# Patient Record
Sex: Male | Born: 1960 | Race: White | Hispanic: No | Smoking: Current every day smoker
Health system: Southern US, Community
[De-identification: ages and names within clinical notes are randomized; demographics above are authoritative.]

## PROBLEM LIST (undated history)

## (undated) DIAGNOSIS — J449 Chronic obstructive pulmonary disease, unspecified: Secondary | ICD-10-CM

## (undated) HISTORY — PX: JOINT REPLACEMENT: SHX530

---

## 2008-08-03 ENCOUNTER — Emergency Department (HOSPITAL_COMMUNITY): Admission: EM | Admit: 2008-08-03 | Discharge: 2008-08-03 | Payer: Self-pay | Admitting: Emergency Medicine

## 2008-08-08 ENCOUNTER — Emergency Department (HOSPITAL_COMMUNITY): Admission: EM | Admit: 2008-08-08 | Discharge: 2008-08-08 | Payer: Self-pay | Admitting: Family Medicine

## 2008-08-24 ENCOUNTER — Emergency Department (HOSPITAL_COMMUNITY): Admission: EM | Admit: 2008-08-24 | Discharge: 2008-08-24 | Payer: Self-pay | Admitting: Family Medicine

## 2008-09-26 ENCOUNTER — Emergency Department (HOSPITAL_COMMUNITY): Admission: EM | Admit: 2008-09-26 | Discharge: 2008-09-26 | Payer: Self-pay | Admitting: Emergency Medicine

## 2008-10-27 ENCOUNTER — Emergency Department (HOSPITAL_COMMUNITY): Admission: EM | Admit: 2008-10-27 | Discharge: 2008-10-27 | Payer: Self-pay | Admitting: Emergency Medicine

## 2008-11-21 ENCOUNTER — Emergency Department (HOSPITAL_COMMUNITY): Admission: EM | Admit: 2008-11-21 | Discharge: 2008-11-21 | Payer: Self-pay | Admitting: Family Medicine

## 2008-11-30 ENCOUNTER — Emergency Department (HOSPITAL_COMMUNITY): Admission: EM | Admit: 2008-11-30 | Discharge: 2008-12-01 | Payer: Self-pay | Admitting: Emergency Medicine

## 2008-12-13 ENCOUNTER — Ambulatory Visit (HOSPITAL_COMMUNITY): Admission: RE | Admit: 2008-12-13 | Discharge: 2008-12-13 | Payer: Self-pay | Admitting: General Surgery

## 2008-12-22 ENCOUNTER — Emergency Department (HOSPITAL_COMMUNITY): Admission: EM | Admit: 2008-12-22 | Discharge: 2008-12-22 | Payer: Self-pay | Admitting: Family Medicine

## 2009-01-22 ENCOUNTER — Emergency Department (HOSPITAL_COMMUNITY): Admission: EM | Admit: 2009-01-22 | Discharge: 2009-01-22 | Payer: Self-pay | Admitting: Family Medicine

## 2009-02-19 ENCOUNTER — Emergency Department (HOSPITAL_COMMUNITY): Admission: EM | Admit: 2009-02-19 | Discharge: 2009-02-19 | Payer: Self-pay | Admitting: Family Medicine

## 2009-03-21 ENCOUNTER — Emergency Department (HOSPITAL_COMMUNITY): Admission: EM | Admit: 2009-03-21 | Discharge: 2009-03-21 | Payer: Self-pay | Admitting: Emergency Medicine

## 2010-12-09 LAB — BASIC METABOLIC PANEL
BUN: 19 mg/dL (ref 6–23)
CO2: 27 mEq/L (ref 19–32)
Chloride: 107 mEq/L (ref 96–112)
Glucose, Bld: 111 mg/dL — ABNORMAL HIGH (ref 70–99)
Potassium: 4 mEq/L (ref 3.5–5.1)

## 2010-12-09 LAB — URINALYSIS, ROUTINE W REFLEX MICROSCOPIC
Ketones, ur: 15 mg/dL — AB
Leukocytes, UA: NEGATIVE
Nitrite: NEGATIVE
Protein, ur: NEGATIVE mg/dL
pH: 6 (ref 5.0–8.0)

## 2010-12-09 LAB — CBC
HCT: 43 % (ref 39.0–52.0)
MCHC: 34.8 g/dL (ref 30.0–36.0)
MCV: 94 fL (ref 78.0–100.0)
Platelets: 244 10*3/uL (ref 150–400)
RDW: 13.3 % (ref 11.5–15.5)

## 2010-12-09 LAB — DIFFERENTIAL
Basophils Absolute: 0.1 10*3/uL (ref 0.0–0.1)
Eosinophils Absolute: 0.1 10*3/uL (ref 0.0–0.7)
Eosinophils Relative: 2 % (ref 0–5)
Lymphs Abs: 1.6 10*3/uL (ref 0.7–4.0)

## 2011-01-12 NOTE — Op Note (Signed)
NAME:  Mason, Gray NO.:  [id number]   MEDICAL RECORD NO.:  [id number]          PATIENT TYPE:  AMB   LOCATION:  DAY                          FACILITY:  [facility]   PHYSICIAN:  Lennie Muckle, MD      DATE OF BIRTH:  [birth year]   DATE OF PROCEDURE:  12/13/2008  DATE OF DISCHARGE:                               OPERATIVE REPORT   PREOPERATIVE DIAGNOSES:  Right inguinal hernia.   POSTOPERATIVE DIAGNOSES:  Right inguinal hernia.   PROCEDURE:  Laparoscopic right inguinal hernia repair.   SURGEON:  Bertram Savin, M.D.   ASSISTANT:  None.   ANESTHESIA:  General endotracheal anesthesia.   FINDINGS:  Right inguinal hernia.   COMPLICATIONS:  No immediate complications.   DRAINS:  No drains were placed.   SPECIMENS:  None.   INDICATIONS FOR PROCEDURE:  Mason Gray is a pleasant 50 year old male who  began having right-sided groin pain in early April.  He has been  diagnosed with an inguinal hernia as a teenager.  He states he had  worsening pain with exertion.  I had seen him and did find a right  inguinal hernia.  It was discussed with him laparoscopic versus open  procedure.  Risks of both procedures were explained.  He elected to  perform laparoscopic repair.  I did talk to him about possibly  converting to an open procedure due to the chronic nature of his hernia.  Informed consent was obtained prior to the procedure.   DETAILS OF PROCEDURE:  Mason Gray was identified in the preoperative  holding area.  He received 2 g of Kefzol and was taken to the operating  room, once in the operating room, placed in a supine position.  He did  void prior to going to the operating room.  He was then placed under  general endotracheal anesthesia.  His abdomen was clipped, prepped and  draped in the usual sterile fashion.  He was placed in a flexed position  to open up the abdominal cavity.  After prepping out his abdominal  cavity, I placed an incision just beneath the umbilicus  after  anesthetizing the skin with Marcaine.  I identified the anterior rectus  fascia.  This was incised with a #11-blade.  I then finger-dissected in  the preperitoneal space while retracting the rectus muscle to the right.  I then placed a Spacemaker Plus into the preperitoneal space.  Using the  35-mm camera, I monitored this while insufflating the balloon.  I  counted approximately 30 times.  I waited approximately a minute.  I  retracted the balloon somewhat cranially.  I then reinsufflated under  inspection with the camera.  I waited another minute.  I removed the  dissecting balloon.  I then insufflated the balloon on the port.   The preperitoneal space was insufflated with CO2.  I placed two 5-mm  trocars under visualization with the camera at the midline.  I began  dissecting laterally along the abdominal wall.  A small hole was made in  the peritoneum.  I closed this with PDS Endoloop.  I then continued  dissecting from the abdominal wall and down to the internal ring.  I was  able to fully dissect the hernia sac away from the spermatic cord and  vessels.  This was somewhat chronic and thickened.  I performed some of  the dissection sharply with laparoscopic scissors.  Another hole was  made in the peritoneum.  After I finished dissecting, I closed this with  a PDS Endoloop.  I was able to fully dissect the sac away from the vas  and the spermatic vessels.  I then placed a 3 x 6 Ultrapro mesh into the  preperitoneal space.  This was tacked to Cooper's ligament, around the  internal ring and laterally while palpating the Protec device.  I held  the inferior edge down while releasing the pneumoperitoneum.  After  removing the trocars I closed the fascial defect at the umbilical region  with a 0 Vicryl suture.  Skin was closed with 4-0 Monocryl.  Steri-  Strips placed and final dressing.  The patient was extubated,  transferred to postanesthesia care unit in stable condition.    He will be monitored briefly postoperatively and will be discharged home  with Percocet.  He was instructed to follow up with me in 3 weeks.      Lennie Muckle, MD  Electronically Signed     ALA/MEDQ  D:  12/13/2008  T:  12/13/2008  Job:  [id number]

## 2011-07-01 ENCOUNTER — Emergency Department (HOSPITAL_COMMUNITY)
Admission: EM | Admit: 2011-07-01 | Discharge: 2011-07-01 | Disposition: A | Discharge status: Home / Self Care | Payer: Self-pay | Attending: Emergency Medicine | Admitting: Emergency Medicine

## 2011-07-01 DIAGNOSIS — M545 Low back pain, unspecified: Secondary | ICD-10-CM | POA: Insufficient documentation

## 2011-07-01 DIAGNOSIS — Z8619 Personal history of other infectious and parasitic diseases: Secondary | ICD-10-CM | POA: Insufficient documentation

## 2011-07-01 DIAGNOSIS — G8929 Other chronic pain: Secondary | ICD-10-CM | POA: Insufficient documentation

## 2011-07-01 DIAGNOSIS — R000 Tachycardia, unspecified: Secondary | ICD-10-CM | POA: Insufficient documentation

## 2011-07-01 DIAGNOSIS — F411 Generalized anxiety disorder: Secondary | ICD-10-CM | POA: Insufficient documentation

## 2017-03-07 ENCOUNTER — Emergency Department: Payer: Self-pay

## 2017-03-07 ENCOUNTER — Emergency Department
Admission: EM | Admit: 2017-03-07 | Discharge: 2017-03-07 | Disposition: A | Discharge status: Home / Self Care | Payer: Self-pay | Attending: Emergency Medicine | Admitting: Emergency Medicine

## 2017-03-07 ENCOUNTER — Encounter: Payer: Self-pay | Admitting: Emergency Medicine

## 2017-03-07 DIAGNOSIS — R52 Pain, unspecified: Secondary | ICD-10-CM

## 2017-03-07 DIAGNOSIS — R0602 Shortness of breath: Secondary | ICD-10-CM | POA: Insufficient documentation

## 2017-03-07 DIAGNOSIS — F17210 Nicotine dependence, cigarettes, uncomplicated: Secondary | ICD-10-CM | POA: Insufficient documentation

## 2017-03-07 DIAGNOSIS — R1013 Epigastric pain: Secondary | ICD-10-CM | POA: Insufficient documentation

## 2017-03-07 DIAGNOSIS — M791 Myalgia: Secondary | ICD-10-CM | POA: Insufficient documentation

## 2017-03-07 DIAGNOSIS — Z791 Long term (current) use of non-steroidal anti-inflammatories (NSAID): Secondary | ICD-10-CM | POA: Insufficient documentation

## 2017-03-07 DIAGNOSIS — Z8709 Personal history of other diseases of the respiratory system: Secondary | ICD-10-CM | POA: Insufficient documentation

## 2017-03-07 LAB — HEPATIC FUNCTION PANEL
ALBUMIN: 4.3 g/dL (ref 3.5–5.0)
ALK PHOS: 57 U/L (ref 38–126)
ALT: 19 U/L (ref 17–63)
AST: 30 U/L (ref 15–41)
Bilirubin, Direct: 0.3 mg/dL (ref 0.1–0.5)
Indirect Bilirubin: 0.9 mg/dL (ref 0.3–0.9)
TOTAL PROTEIN: 7.7 g/dL (ref 6.5–8.1)
Total Bilirubin: 1.2 mg/dL (ref 0.3–1.2)

## 2017-03-07 LAB — BASIC METABOLIC PANEL
ANION GAP: 10 (ref 5–15)
BUN: 23 mg/dL — ABNORMAL HIGH (ref 6–20)
CHLORIDE: 105 mmol/L (ref 101–111)
CO2: 25 mmol/L (ref 22–32)
Calcium: 9.7 mg/dL (ref 8.9–10.3)
Creatinine, Ser: 1.09 mg/dL (ref 0.61–1.24)
Glucose, Bld: 98 mg/dL (ref 65–99)
POTASSIUM: 4 mmol/L (ref 3.5–5.1)
SODIUM: 140 mmol/L (ref 135–145)

## 2017-03-07 LAB — CBC WITH DIFFERENTIAL/PLATELET
BASOS ABS: 0.1 10*3/uL (ref 0–0.1)
BASOS PCT: 1 %
EOS ABS: 0.1 10*3/uL (ref 0–0.7)
Eosinophils Relative: 1 %
HCT: 47.6 % (ref 40.0–52.0)
HEMOGLOBIN: 16.1 g/dL (ref 13.0–18.0)
LYMPHS ABS: 2.6 10*3/uL (ref 1.0–3.6)
Lymphocytes Relative: 19 %
MCH: 31.1 pg (ref 26.0–34.0)
MCHC: 33.8 g/dL (ref 32.0–36.0)
MCV: 92.3 fL (ref 80.0–100.0)
Monocytes Absolute: 1.5 10*3/uL — ABNORMAL HIGH (ref 0.2–1.0)
Monocytes Relative: 11 %
NEUTROS PCT: 70 %
Neutro Abs: 9.7 10*3/uL — ABNORMAL HIGH (ref 1.4–6.5)
PLATELETS: 389 10*3/uL (ref 150–440)
RBC: 5.16 MIL/uL (ref 4.40–5.90)
RDW: 13.3 % (ref 11.5–14.5)
WBC: 14 10*3/uL — AB (ref 3.8–10.6)

## 2017-03-07 LAB — LIPASE, BLOOD: Lipase: 53 U/L — ABNORMAL HIGH (ref 11–51)

## 2017-03-07 MED ORDER — KETOROLAC TROMETHAMINE 30 MG/ML IJ SOLN
15.0000 mg | Freq: Once | INTRAMUSCULAR | Status: AC
  Administered 2017-03-07: 15 mg via INTRAVENOUS
  Filled 2017-03-07: qty 1

## 2017-03-07 MED ORDER — IBUPROFEN 600 MG PO TABS: 600.0000 mg | ORAL_TABLET | Freq: Three times a day (TID) | ORAL | 0 refills | Status: AC | PRN

## 2017-03-07 NOTE — ED Notes (Addendum)
Pt. Verbalizes understanding of d/c instructions, prescriptions, and follow-up. VS stable and pain controlled per pt.  Pt. In NAD at time of d/c and denies further concerns regarding this visit. Pt. Stable at the time of departure from the unit, departing unit by the safest and most appropriate manner per that pt condition and limitations. Pt advised to return to the ED at any time for emergent concerns, or for new/worsening symptoms.   Pt placed in lobby with belongings d/t refusal to wait for [facility] in tx room 6. [employer] aware of situation at that [facility] at [facility] states if pt walks outside lobby will be d/c from program. BPD, this RN, pt all aware.

## 2017-03-07 NOTE — ED Notes (Signed)
Pt in US at this time 

## 2017-03-07 NOTE — Discharge Instructions (Signed)
Please take ibuprofen every 8 hours as needed for pain and establish care with primary care within the next week for recheck. Return to the emergency department for any concerns.  It was a pleasure to take care of you today, and thank you for coming to our emergency department.  If you have any questions or concerns before leaving please ask the nurse to grab me and I'm more than happy to go through your aftercare instructions again.  If you were prescribed any opioid pain medication today such as Norco, Vicodin, Percocet, morphine, hydrocodone, or oxycodone please make sure you do not drive when you are taking this medication as it can alter your ability to drive safely.  If you have any concerns once you are home that you are not improving or are in fact getting worse before you can make it to your follow-up appointment, please do not hesitate to call 911 and come back for further evaluation.  Merrily BrittleNeil Talajah Slimp MD  Results for orders placed or performed during the hospital encounter of 03/07/17  CBC with Differential  Result Value Ref Range   WBC 14.0 (H) 3.8 - 10.6 K/uL   RBC 5.16 4.40 - 5.90 MIL/uL   Hemoglobin 16.1 13.0 - 18.0 g/dL   HCT 14.747.6 82.940.0 - 56.252.0 %   MCV 92.3 80.0 - 100.0 fL   MCH 31.1 26.0 - 34.0 pg   MCHC 33.8 32.0 - 36.0 g/dL   RDW 13.013.3 86.511.5 - 78.414.5 %   Platelets 389 150 - 440 K/uL   Neutrophils Relative % 70 %   Neutro Abs 9.7 (H) 1.4 - 6.5 K/uL   Lymphocytes Relative 19 %   Lymphs Abs 2.6 1.0 - 3.6 K/uL   Monocytes Relative 11 %   Monocytes Absolute 1.5 (H) 0.2 - 1.0 K/uL   Eosinophils Relative 1 %   Eosinophils Absolute 0.1 0 - 0.7 K/uL   Basophils Relative 1 %   Basophils Absolute 0.1 0 - 0.1 K/uL  Basic metabolic panel  Result Value Ref Range   Sodium 140 135 - 145 mmol/L   Potassium 4.0 3.5 - 5.1 mmol/L   Chloride 105 101 - 111 mmol/L   CO2 25 22 - 32 mmol/L   Glucose, Bld 98 65 - 99 mg/dL   BUN 23 (H) 6 - 20 mg/dL   Creatinine, Ser 6.961.09 0.61 - 1.24 mg/dL   Calcium 9.7 8.9 - 29.510.3 mg/dL   GFR calc non Af Amer >60 >60 mL/min   GFR calc Af Amer >60 >60 mL/min   Anion gap 10 5 - 15  Hepatic function panel  Result Value Ref Range   Total Protein 7.7 6.5 - 8.1 g/dL   Albumin 4.3 3.5 - 5.0 g/dL   AST 30 15 - 41 U/L   ALT 19 17 - 63 U/L   Alkaline Phosphatase 57 38 - 126 U/L   Total Bilirubin 1.2 0.3 - 1.2 mg/dL   Bilirubin, Direct 0.3 0.1 - 0.5 mg/dL   Indirect Bilirubin 0.9 0.3 - 0.9 mg/dL  Lipase, blood  Result Value Ref Range   Lipase 53 (H) 11 - 51 U/L   Dg Chest 2 View  Result Date: 03/07/2017 CLINICAL DATA:  Chest pain and shortness of breath. History of pneumothorax. EXAM: CHEST  2 VIEW COMPARISON:  None. FINDINGS: Emphysema with bullous changes at the apices and chain sutures at the right lung apex. Chain sutures also noted posteriorly, possibly in the left upper lung. No pneumothorax. Normal heart size  and mediastinal contours. No pulmonary edema. No focal airspace consolidation. No pleural fluid. No acute osseous abnormalities, minimal loss of height of midthoracic vertebral bodies appears chronic. Remote right rib fractures. IMPRESSION: Emphysema with biapical bullous change. Postsurgical change on the right and possibly left upper lobes. No acute abnormality. No pneumothorax. Electronically Signed   By: Rubye Oaks M.D.   On: 03/07/2017 18:06   US Abdomen Limited Ruq  Result Date: 03/07/2017 CLINICAL DATA:  Abdomen pain for 1 day EXAM: ULTRASOUND ABDOMEN LIMITED RIGHT UPPER QUADRANT COMPARISON:  None. FINDINGS: Gallbladder: The gallbladder is partially contracted No gallstones visualized. The gallbladder wall measures 4 mm. No sonographic Murphy sign noted by sonographer. Common bile duct: Diameter: 3 mm Liver: No focal lesion identified. Within normal limits in parenchymal echogenicity. IMPRESSION: Partially contracted gallbladder.  No acute abnormality identified. Electronically Signed   By: Sherian Rein M.D.   On: 03/07/2017 20:22

## 2017-03-07 NOTE — ED Notes (Signed)
Pt returned to tx room. Lab at bedside

## 2017-03-07 NOTE — ED Notes (Addendum)
Called lab for phlebotomist. Pt attempting urine sample at this time

## 2017-03-07 NOTE — ED Provider Notes (Signed)
[facility] Emergency Department Provider Note  ____________________________________________   First MD Initiated Contact with Patient 03/07/17 1820     (approximate)  I have reviewed the triage vital signs and the nursing notes.   HISTORY  Chief Complaint Shortness of Breath    HPI Mason Gray is a 56 y.o. male who comes to the emergency department with sharp right upper chest pain worse with deep inspiration. 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 His pain is sharp pleuritic nonexertional. No abdominal pain. No nausea vomiting. Worse when coughing improved when not coughing.   No past medical history on file.  There are no active problems to display for this patient.   No past surgical history on file.  Prior to Admission medications   Medication Sig Start Date End Date Taking? Authorizing Provider  naproxen (NAPROSYN) 500 MG tablet Take 500 mg by mouth 2 (two) times daily. 06/07/15  Yes [provider]  ibuprofen (ADVIL,MOTRIN) 600 MG tablet Take 1 tablet (600 mg total) by mouth every 8 (eight) hours as needed. 03/07/17   Merrily Brittleifenbark, Remiel Corti, MD    Allergies Patient has no known allergies.  No family history on file.  Social History Social History  Substance Use Topics  . Smoking status: Current Every Day Smoker  . Smokeless tobacco: Never Used  . Alcohol use Not on file    Review of Systems Constitutional: No fever/chills Eyes: No visual changes. ENT: No sore throat. Cardiovascular: Positive chest pain. Respiratory: Positive shortness of breath. Gastrointestinal: Positive abdominal pain.  Positive nausea, no vomiting.  No diarrhea.  No constipation. Genitourinary: Negative for dysuria. Musculoskeletal: Negative for back pain. Skin: Negative for rash. Neurological:  Negative for headaches, focal weakness or numbness.   ____________________________________________   PHYSICAL EXAM:  VITAL SIGNS: ED Triage Vitals  Enc Vitals Group     BP 03/07/17 1737 106/69     Pulse Rate 03/07/17 1737 (!) 105     Resp 03/07/17 1737 16     Temp 03/07/17 1737 98.2 F (36.8 C)     Temp Source 03/07/17 1737 Oral     SpO2 03/07/17 1737 98 %     Weight 03/07/17 1738 175 lb (79.4 kg)     Height 03/07/17 1738 5\' 11"  (1.803 m)     Head Circumference --      Peak Flow --      Pain Score 03/07/17 1742 8     Pain Loc --      Pain Edu? --      Excl. in GC? --     Constitutional: Alert and oriented 4 anxious appearing nontoxic no diaphoresis speaks in full clear sentences Eyes: PERRL EOMI. Head: Atraumatic. Nose: No congestion/rhinnorhea. Mouth/Throat: No trismus Neck: No stridor.   Cardiovascular: Normal rate, regular rhythm. Grossly normal heart sounds.  Good peripheral circulation. Respiratory: Normal respiratory effort.  No retractions. Lungs CTAB and moving good air Gastrointestinal: Soft nontender Musculoskeletal: No lower extremity edema   Neurologic:  Normal speech and language. No gross focal neurologic deficits are appreciated. Skin:  Skin is warm, dry and intact. No rash noted. Psychiatric: Anxious appearing    ____________________________________________   DIFFERENTIAL includes but not limited to  Pneumothorax, pulmonary embolism, pneumonia, COPD ____________________________________________   LABS (all labs ordered are listed, but only abnormal  results are displayed)  Labs Reviewed  CBC WITH DIFFERENTIAL/PLATELET - Abnormal; Notable for the following:       Result Value   WBC 14.0 (*)    Neutro Abs 9.7 (*)    Monocytes Absolute 1.5 (*)    All other components within normal limits  BASIC METABOLIC PANEL - Abnormal; Notable for the following:    BUN 23 (*)    All other components within normal limits  LIPASE, BLOOD - Abnormal; Notable  for the following:    Lipase 53 (*)    All other components within normal limits  HEPATIC FUNCTION PANEL    Labs unremarkable __________________________________________  EKG  ED ECG REPORT I, Merrily Brittle, the attending physician, personally viewed and interpreted this ECG.  Date: 03/07/2017 EKG Time:  Rate: 108 Rhythm: Sinus tachycardia QRS Axis: normal Intervals: normal ST/T Wave abnormalities: normal Narrative Interpretation: Borderline  ____________________________________________  RADIOLOGY  Chest x-ray with no acute disease ____________________________ Right upper quadrant ultrasound negative ________________   PROCEDURES  Procedure(s) performed: no  Procedures  Critical Care performed: no  Observation: no ____________________________________________   INITIAL IMPRESSION / ASSESSMENT AND PLAN / ED COURSE  Pertinent labs & imaging results that were available during my care of the patient were reviewed by me and considered in my medical decision making (see chart for details).  Fortunately the patient does not have a pneumothorax. She does report some postprandial epigastric discomfort so I obtained an ultrasound of his right upper quadrant which is also negative. His pain is controlled with Toradol and he would like to go home. He is discharged home in improved condition.      ____________________________________________   FINAL CLINICAL IMPRESSION(S) / ED DIAGNOSES  Final diagnoses:  Pain  SOB (shortness of breath)      NEW MEDICATIONS STARTED DURING THIS VISIT:  Discharge Medication List as of 03/07/2017  9:31 PM    START taking these medications   Details  ibuprofen (ADVIL,MOTRIN) 600 MG tablet Take 1 tablet (600 mg total) by mouth every 8 (eight) hours as needed., Starting Mon 03/07/2017, Print         Note:  This document was prepared using Dragon voice recognition software and may include unintentional dictation errors.       Merrily Brittle, MD 03/08/17 [phone]

## 2017-03-07 NOTE — ED Notes (Signed)
Patient was brought to the ED by staff at [facility] for shortness of breath and symptoms similar to past pneumothoraxes.  Patient is detoxing from opoids and cocaine.  Staff states it is very important that patient be transported back to [facility] by staff if discharged.  If anyone else takes patient to [facility] he will be discharged from the program.  Patient is aware of this.

## 2018-01-24 IMAGING — CR DG CHEST 2V
1 series · 2 of 2 positions shown · non-contrast
Comparison: None.

CLINICAL DATA: Chest pain and shortness of breath. History of
pneumothorax.

EXAM:
CHEST  2 VIEW

[Series 1: dg chest 2 view · 0.14mm/px · 2 of 2 slices shown]
[im 1/2]
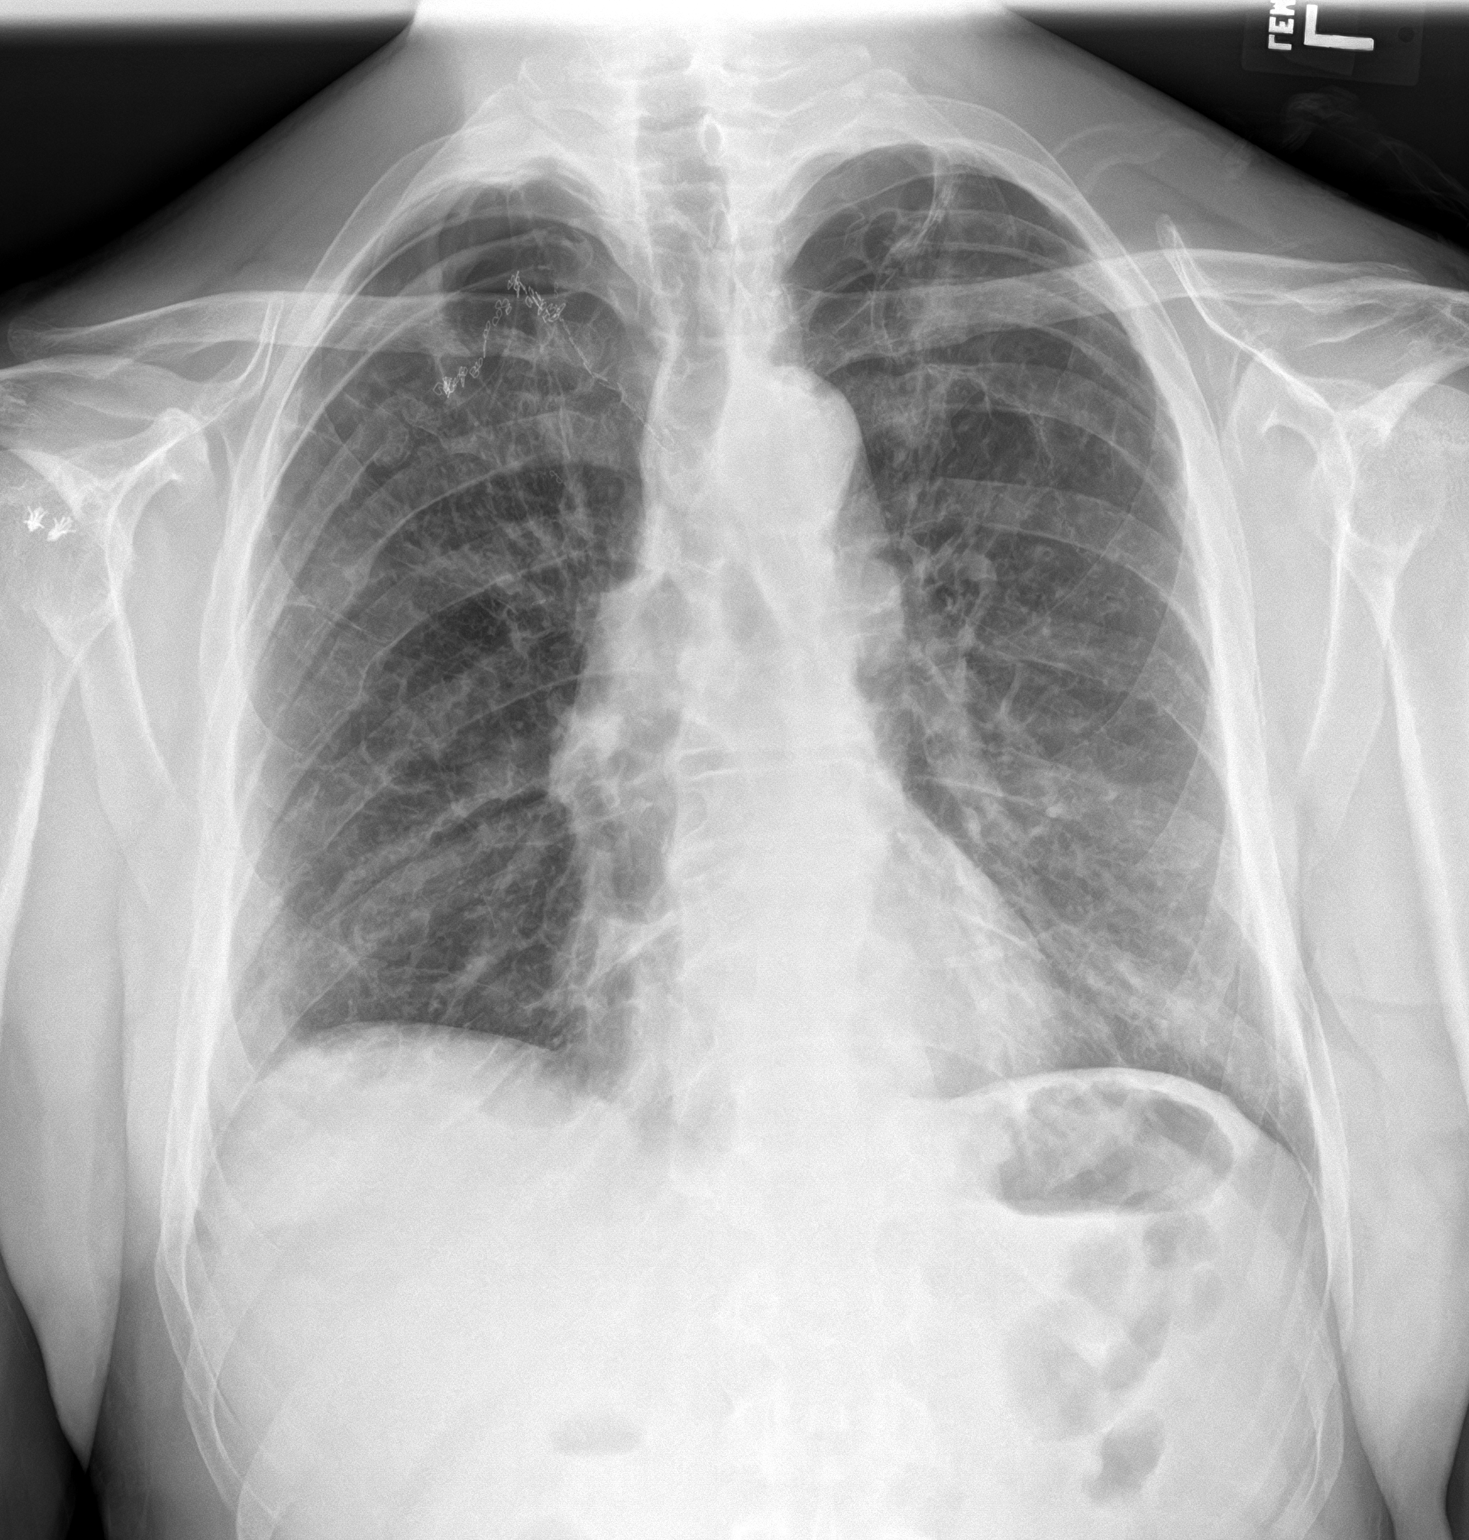
[im 2/2]
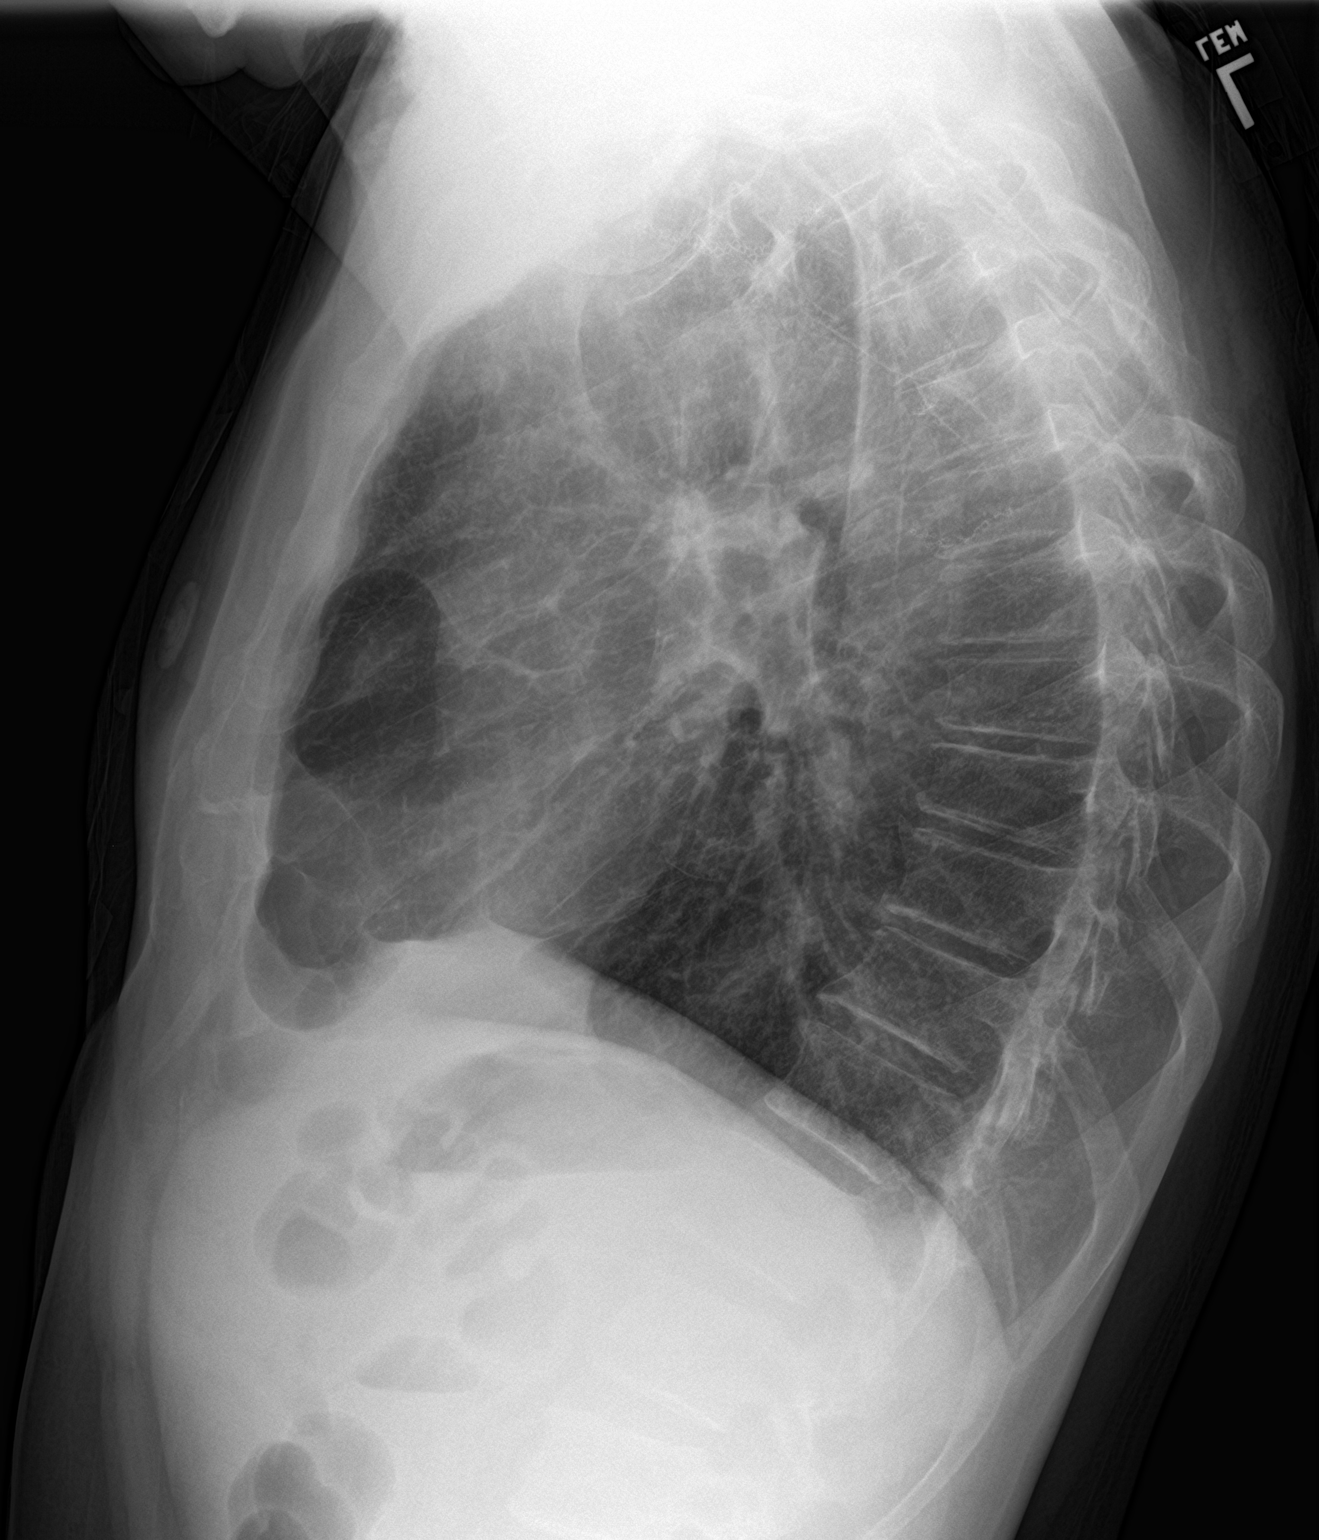

[2 of 2 positions shown; findings below may reference images not displayed]

FINDINGS: Emphysema with bullous changes at the apices and chain sutures at
the right lung apex. Chain sutures also noted posteriorly, possibly
in the left upper lung. No pneumothorax. Normal heart size and
mediastinal contours. No pulmonary edema. No focal airspace
consolidation. No pleural fluid. No acute osseous abnormalities,
minimal loss of height of midthoracic vertebral bodies appears
chronic. Remote right rib fractures.
IMPRESSION: Emphysema with biapical bullous change. Postsurgical change on the
right and possibly left upper lobes. No acute abnormality. No
pneumothorax.

## 2018-10-19 IMAGING — US US ABDOMEN LIMITED
1 series · 14 of 25 positions shown · non-contrast
Comparison: None.

CLINICAL DATA: Abdomen pain for 1 day

EXAM:
ULTRASOUND ABDOMEN LIMITED RIGHT UPPER QUADRANT

[Series 1: us abdomen limited · 0.15mm/px · 14 of 37 slices shown]
[im 1/37]
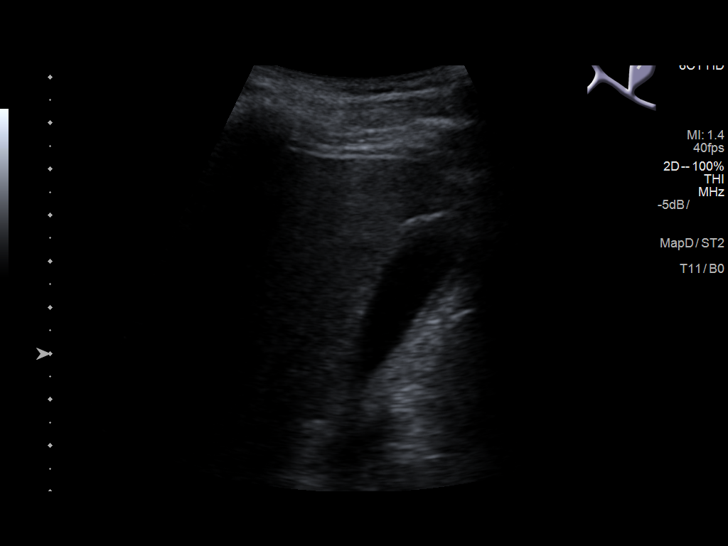
[im 4/37]
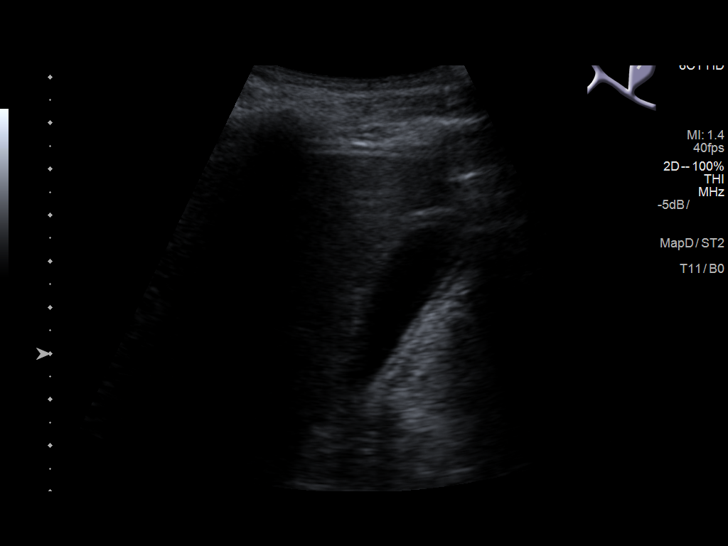
[im 7/37]
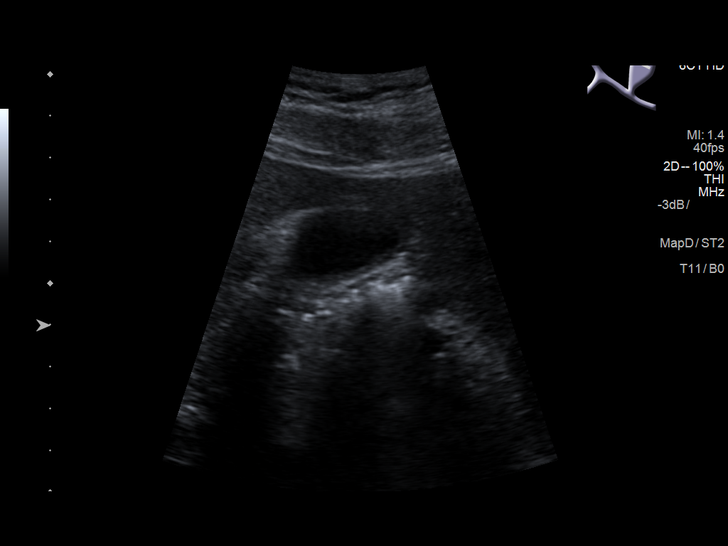
[im 10/37]
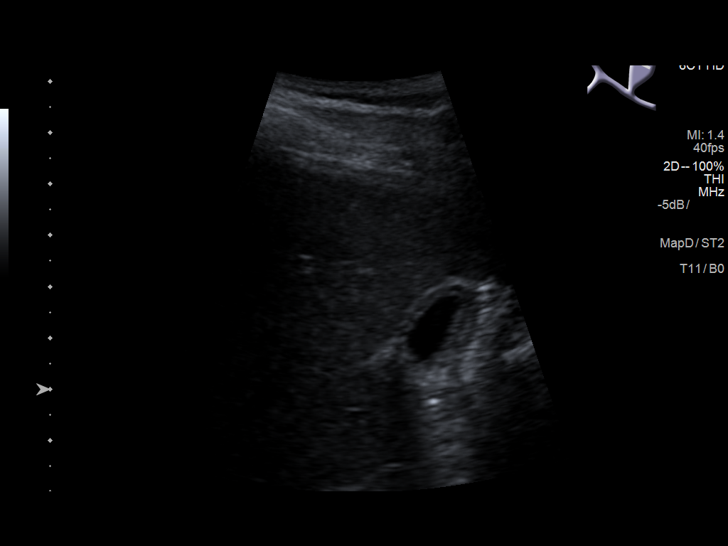
[im 13/37]
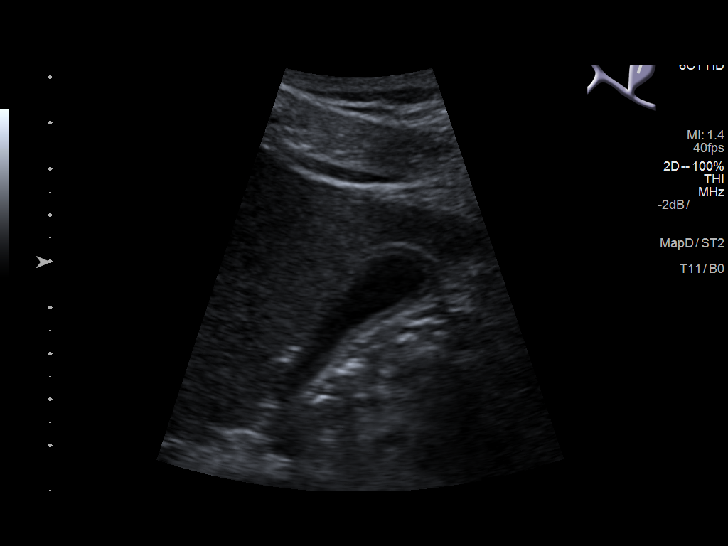
[im 14/37]
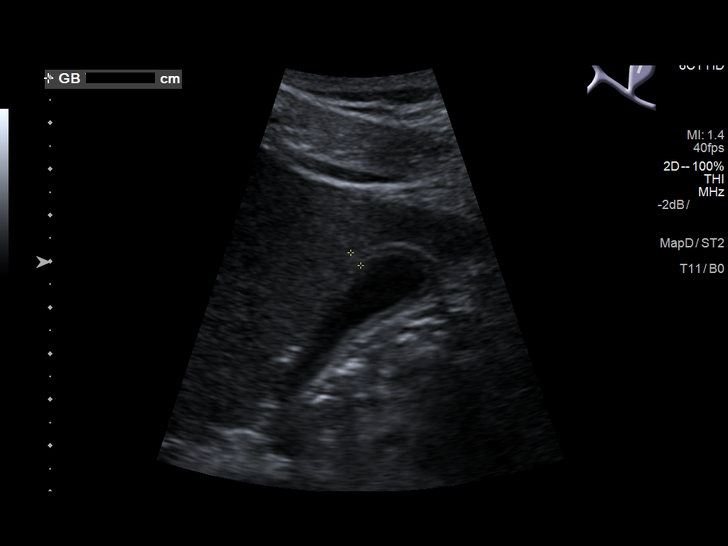
[im 17/37]
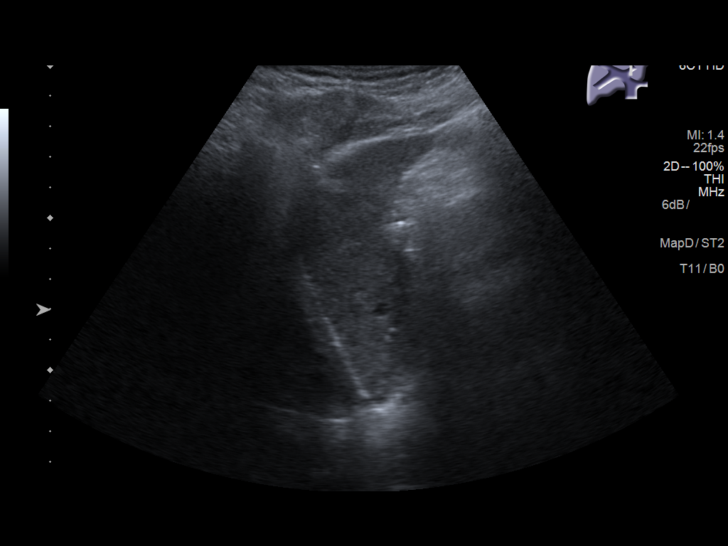
[im 20/37]
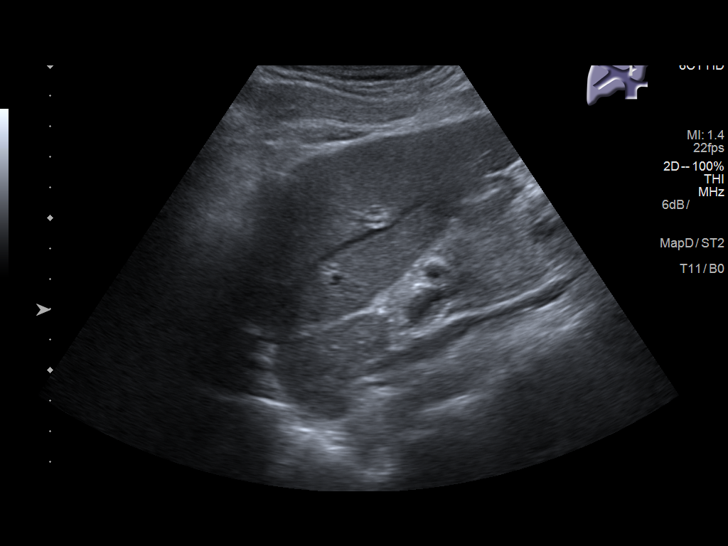
[im 23/37]
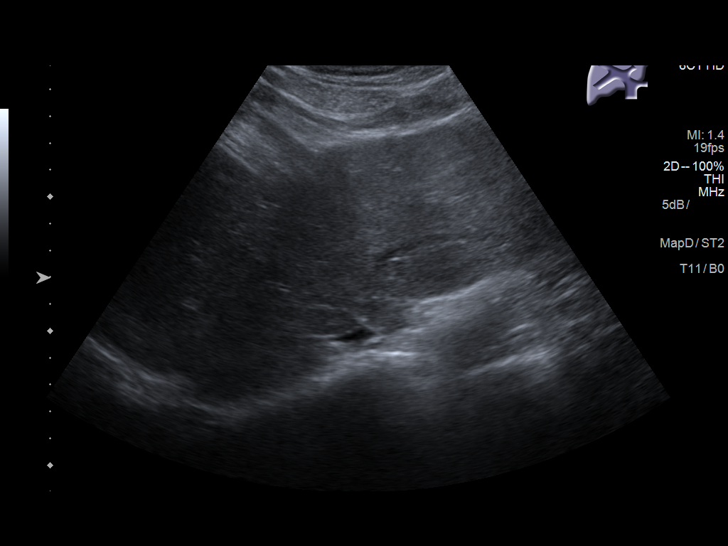
[im 25/37]
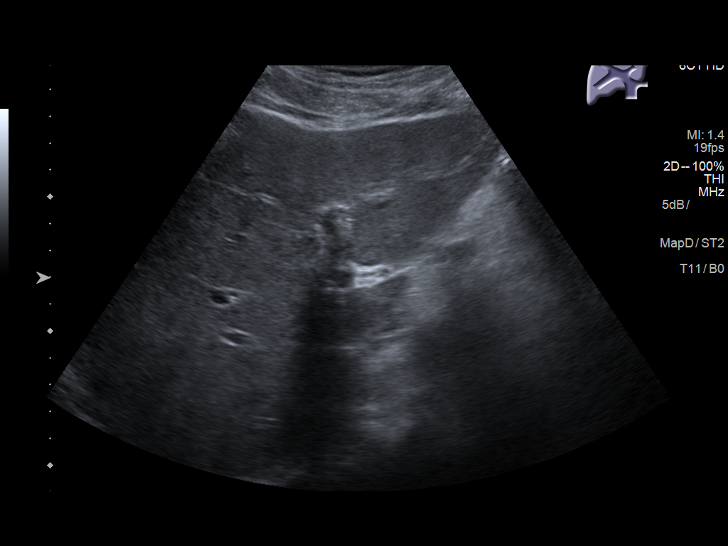
[im 28/37]
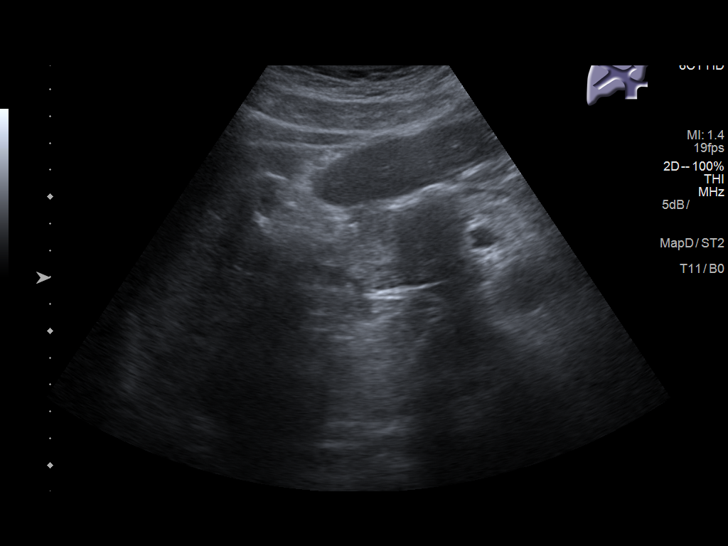
[im 31/37]
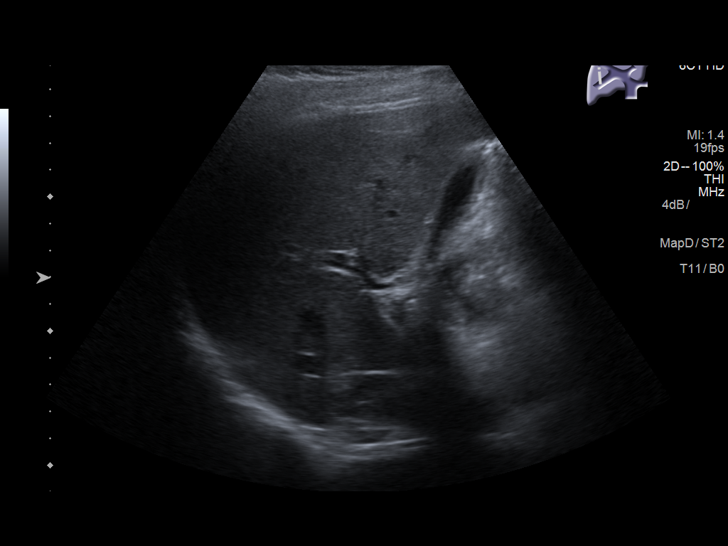
[im 34/37]
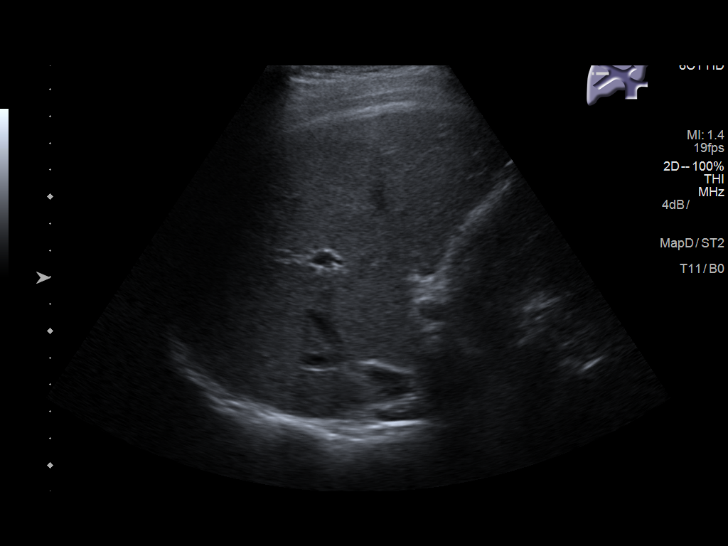
[im 37/37]
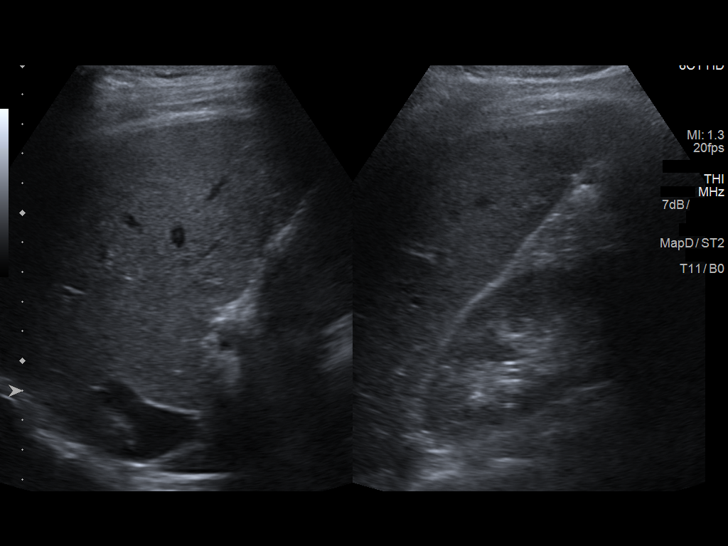

[14 of 25 positions shown; findings below may reference images not displayed]

FINDINGS: Gallbladder:

The gallbladder is partially contracted No gallstones visualized.
The gallbladder wall measures 4 mm. No sonographic Murphy sign noted
by sonographer.

Common bile duct:

Diameter: 3 mm

Liver:

No focal lesion identified. Within normal limits in parenchymal
echogenicity.
IMPRESSION: Partially contracted gallbladder.  No acute abnormality identified.

## 2022-11-30 ENCOUNTER — Emergency Department (HOSPITAL_COMMUNITY): Payer: 59

## 2022-11-30 ENCOUNTER — Emergency Department (HOSPITAL_COMMUNITY)
Admission: EM | Admit: 2022-11-30 | Discharge: 2022-12-01 | Disposition: A | Discharge status: Home / Self Care | Payer: 59 | Attending: Emergency Medicine | Admitting: Emergency Medicine

## 2022-11-30 ENCOUNTER — Other Ambulatory Visit: Payer: Self-pay

## 2022-11-30 ENCOUNTER — Encounter (HOSPITAL_COMMUNITY): Payer: Self-pay

## 2022-11-30 DIAGNOSIS — R1084 Generalized abdominal pain: Secondary | ICD-10-CM | POA: Diagnosis present

## 2022-11-30 DIAGNOSIS — K4020 Bilateral inguinal hernia, without obstruction or gangrene, not specified as recurrent: Secondary | ICD-10-CM | POA: Insufficient documentation

## 2022-11-30 HISTORY — DX: Chronic obstructive pulmonary disease, unspecified: J44.9

## 2022-11-30 LAB — RAPID URINE DRUG SCREEN, HOSP PERFORMED
Amphetamines: POSITIVE — AB
Barbiturates: NOT DETECTED
Benzodiazepines: NOT DETECTED
Cocaine: NOT DETECTED
Opiates: POSITIVE — AB
Tetrahydrocannabinol: NOT DETECTED

## 2022-11-30 LAB — COMPREHENSIVE METABOLIC PANEL
ALT: 39 U/L (ref 0–44)
AST: 37 U/L (ref 15–41)
Albumin: 3.6 g/dL (ref 3.5–5.0)
Alkaline Phosphatase: 47 U/L (ref 38–126)
Anion gap: 8 (ref 5–15)
BUN: 22 mg/dL (ref 8–23)
CO2: 25 mmol/L (ref 22–32)
Calcium: 8.8 mg/dL — ABNORMAL LOW (ref 8.9–10.3)
Chloride: 104 mmol/L (ref 98–111)
Creatinine, Ser: 0.87 mg/dL (ref 0.61–1.24)
GFR, Estimated: >60 mL/min (ref >60)
Glucose, Bld: 90 mg/dL (ref 70–99)
Potassium: 4 mmol/L (ref 3.5–5.1)
Sodium: 137 mmol/L (ref 135–145)
Total Bilirubin: 0.6 mg/dL (ref 0.3–1.2)
Total Protein: 6.8 g/dL (ref 6.5–8.1)

## 2022-11-30 LAB — CBC WITH DIFFERENTIAL/PLATELET
Abs Immature Granulocytes: 0.01 10*3/uL (ref 0.00–0.07)
Basophils Absolute: 0 10*3/uL (ref 0.0–0.1)
Basophils Relative: 1 %
Eosinophils Absolute: 0.1 10*3/uL (ref 0.0–0.5)
Eosinophils Relative: 3 %
HCT: 36.3 % — ABNORMAL LOW (ref 39.0–52.0)
Hemoglobin: 12.2 g/dL — ABNORMAL LOW (ref 13.0–17.0)
Immature Granulocytes: 0 %
Lymphocytes Relative: 27 %
Lymphs Abs: 1.1 10*3/uL (ref 0.7–4.0)
MCH: 32.7 pg (ref 26.0–34.0)
MCHC: 33.6 g/dL (ref 30.0–36.0)
MCV: 97.3 fL (ref 80.0–100.0)
Monocytes Absolute: 0.5 10*3/uL (ref 0.1–1.0)
Monocytes Relative: 12 %
Neutro Abs: 2.4 10*3/uL (ref 1.7–7.7)
Neutrophils Relative %: 57 %
Platelets: 173 10*3/uL (ref 150–400)
RBC: 3.73 MIL/uL — ABNORMAL LOW (ref 4.22–5.81)
RDW: 13.2 % (ref 11.5–15.5)
WBC: 4.2 10*3/uL (ref 4.0–10.5)
nRBC: 0 % (ref 0.0–0.2)

## 2022-11-30 LAB — URINALYSIS, ROUTINE W REFLEX MICROSCOPIC
Bilirubin Urine: NEGATIVE
Glucose, UA: NEGATIVE mg/dL
Hgb urine dipstick: NEGATIVE
Ketones, ur: NEGATIVE mg/dL
Leukocytes,Ua: NEGATIVE
Nitrite: NEGATIVE
Protein, ur: NEGATIVE mg/dL
Specific Gravity, Urine: 1.025 (ref 1.005–1.030)
pH: 5 (ref 5.0–8.0)

## 2022-11-30 LAB — LACTIC ACID, PLASMA
Lactic Acid, Venous: 0.8 mmol/L (ref 0.5–1.9)
Lactic Acid, Venous: 1.2 mmol/L (ref 0.5–1.9)

## 2022-11-30 MED ORDER — ONDANSETRON HCL 4 MG/2ML IJ SOLN
4.0000 mg | Freq: Once | INTRAMUSCULAR | Status: AC
  Administered 2022-11-30: 4 mg via INTRAVENOUS
  Filled 2022-11-30: qty 2

## 2022-11-30 MED ORDER — MORPHINE SULFATE (PF) 4 MG/ML IV SOLN
4.0000 mg | Freq: Once | INTRAVENOUS | Status: AC
  Administered 2022-11-30: 4 mg via INTRAVENOUS
  Filled 2022-11-30: qty 1

## 2022-11-30 NOTE — ED Provider Notes (Signed)
[facility] Provider Note   CSN: [id number] Arrival date & time: 11/30/22  1718     History {Add pertinent medical, surgical, social history, OB history to HPI:1} Chief Complaint  Patient presents with   Inguinal Hernia    Mason Gray is a 62 y.o. male.  62 year old male with a history of reported remote ventral hernia repair and right inguinal hernia who presents emergency department with testicular pain and swelling.  Patient reports that he had a hernia that has been longstanding in his right groin.  Says that over the past few months is also started to involve his left groin and 2 months ago became so large he was unable to push it in.  Reports that today he has been having worsening pain.  Has had nausea but no vomiting.  Denies any diarrhea.  Says he is having difficulty urinating as well.  Does have a history of polysubstance abuse and is on Suboxone.  Says that he has been getting nonprescribed Suboxone and splits his tablets to take 2 mg every other day.  Denies any other abdominal surgeries.       Home Medications Prior to Admission medications   Medication Sig Start Date End Date Taking? Authorizing Provider  ibuprofen (ADVIL,MOTRIN) 600 MG tablet Take 1 tablet (600 mg total) by mouth every 8 (eight) hours as needed. 03/07/17   Darel Hong, MD  naproxen (NAPROSYN) 500 MG tablet Take 500 mg by mouth 2 (two) times daily. 06/07/15   [provider]      Allergies    Patient has no known allergies.    Review of Systems   Review of Systems  Physical Exam Updated Vital Signs BP 110/72   Pulse 87   Temp 98.7 F (37.1 C) (Oral)   Resp 16   Ht 5\' 11"  (1.803 m)   Wt 77.1 kg   SpO2 99%   BMI 23.71 kg/m  Physical Exam Vitals and nursing note reviewed.  Constitutional:      General: He is not in acute distress.    Appearance: He is well-developed.  HENT:     Head: Normocephalic and atraumatic.     Right Ear:  External ear normal.     Left Ear: External ear normal.     Nose: Nose normal.  Eyes:     Extraocular Movements: Extraocular movements intact.     Conjunctiva/sclera: Conjunctivae normal.     Pupils: Pupils are equal, round, and reactive to light.  Cardiovascular:     Rate and Rhythm: Normal rate and regular rhythm.  Pulmonary:     Effort: Pulmonary effort is normal. No respiratory distress.  Abdominal:     General: There is no distension.     Palpations: Abdomen is soft. There is no mass.     Tenderness: There is abdominal tenderness (Diffuse). There is no guarding.  Genitourinary:    Comments: Bilateral inguinal hernias that are nonreducible Musculoskeletal:     Cervical back: Normal range of motion and neck supple.  Skin:    General: Skin is warm and dry.  Neurological:     Mental Status: He is alert. Mental status is at baseline.  Psychiatric:        Mood and Affect: Mood normal.        Behavior: Behavior normal.     ED Results / Procedures / Treatments   Labs (all labs ordered are listed, but only abnormal results are displayed) Labs Reviewed  COMPREHENSIVE METABOLIC PANEL  CBC WITH DIFFERENTIAL/PLATELET  URINALYSIS, ROUTINE W REFLEX MICROSCOPIC  LACTIC ACID, PLASMA  LACTIC ACID, PLASMA    EKG None  Radiology No results found.  Procedures Procedures   Medications Ordered in ED Medications  morphine (PF) 4 MG/ML injection 4 mg (has no administration in time range)  ondansetron (ZOFRAN) injection 4 mg (has no administration in time range)    ED Course/ Medical Decision Making/ A&P                             Medical Decision Making Amount and/or Complexity of Data Reviewed Labs: ordered.  Risk Prescription drug management.   ***  {Document critical care time when appropriate:1} {Document review of labs and clinical decision tools ie heart score, Chads2Vasc2 etc:1}  {Document your independent review of radiology images, and any outside  records:1} {Document your discussion with family members, caretakers, and with consultants:1} {Document social determinants of health affecting pt's care:1} {Document your decision making why or why not admission, treatments were needed:1} Final Clinical Impression(s) / ED Diagnoses Final diagnoses:  None    Rx / DC Orders ED Discharge Orders     None

## 2022-11-30 NOTE — TOC CAGE-AID Note (Signed)
Transition of Care [facility]) - CAGE-AID Screening   Patient Details  Name: Mason Gray MRN: [id number] Date of Birth: 12-21-1960  Transition of [facility]) CM/SW Contact:    Gaetano Hawthorne Tarpley-Carter, Charles Phone Number: 11/30/2022, 7:22 PM   Clinical Narrative: Pt participated in [place].  Pt stated he does use substance.  Pt was offered resources, due to usage of substance.  Deryck Hippler Tarpley-Carter, MSW, LCSW-A Pronouns:  She/Her/Hers [facility] Clinical Social Worker Direct Number:  [phone] [email or web address]       CAGE-AID Screening:    Have You Ever Felt You Ought to Cut Down on Your Drinking or Drug Use?: Yes Have People Annoyed You By Critizing Your Drinking Or Drug Use?: No Have You Felt Bad Or Guilty About Your Drinking Or Drug Use?: Yes Have You Ever Had a Drink or Used Drugs First Thing In The Morning to Steady Your Nerves or to Get Rid of a Hangover?: No CAGE-AID Score: 2  Substance Abuse Education Offered: Yes  Substance abuse interventions: Scientist, clinical (histocompatibility and immunogenetics)

## 2022-11-30 NOTE — ED Triage Notes (Addendum)
Pt brought in by EMS to get his inguinal hernia evaluated. Pt reports he has had it for over a year and about 2 months ago it has been unable to be pushed back in. Pt does report some increased issues starting his stream over the last few weeks, pt is on flomax.   Pt is also looking for resources for drug rehab, stopped going to his methadone clinic several months ago

## 2022-11-30 NOTE — Progress Notes (Addendum)
TOC CSW consulted with pt at bedside.  Pt is in need of resources for substance use and shelter.  CSW provided pt with resources and information.  Pt stated he was in a relationship for 10 years and decided to leave.  Pt stated that he left because the environment was toxic.  Pt stated he just wants peace at this age.   Mason Gray, MSW, LCSW-A Pronouns:  She/Her/Hers [facility] Clinical Social Worker Direct Number:  [phone] [email or web address]

## 2022-12-01 ENCOUNTER — Emergency Department (HOSPITAL_COMMUNITY): Payer: 59

## 2022-12-01 DIAGNOSIS — K4020 Bilateral inguinal hernia, without obstruction or gangrene, not specified as recurrent: Secondary | ICD-10-CM | POA: Diagnosis not present

## 2022-12-01 MED ORDER — IOHEXOL 350 MG/ML SOLN
75.0000 mL | Freq: Once | INTRAVENOUS | Status: AC | PRN
  Administered 2022-11-30: 75 mL via INTRAVENOUS

## 2022-12-01 NOTE — Discharge Instructions (Signed)
You were seen for your hernia in the emergency department.   Follow-up with your primary doctor in 2-3 days regarding your visit.    Please use the resources above for substance use.  Return immediately to the emergency department if you experience any of the following: Worsening pain, vomiting, or any other concerning symptoms.    Thank you for visiting our Emergency Department. It was a pleasure taking care of you today.

## 2023-02-10 NOTE — Progress Notes (Signed)
Has hx of substance abuse. Marijuana has been associated with abdominal pain and n/v.
